# Patient Record
Sex: Female | Born: 2005
Health system: Southern US, Community
[De-identification: ages and names within clinical notes are randomized; demographics above are authoritative.]

## PROBLEM LIST (undated history)

## (undated) ENCOUNTER — Emergency Department: Admission: EM

## (undated) DIAGNOSIS — F909 Attention-deficit hyperactivity disorder, unspecified type: Secondary | ICD-10-CM

## (undated) HISTORY — DX: Attention-deficit hyperactivity disorder, unspecified type: F90.9

---
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
## 2024-01-21 ENCOUNTER — Emergency Department
Admission: EM | Admit: 2024-01-21 | Discharge: 2024-01-21 | Disposition: A | Discharge status: Home / Self Care | Attending: Emergency Medicine | Admitting: Emergency Medicine

## 2024-01-21 ENCOUNTER — Other Ambulatory Visit: Payer: Self-pay

## 2024-01-21 ENCOUNTER — Emergency Department

## 2024-01-21 DIAGNOSIS — R29818 Other symptoms and signs involving the nervous system: Secondary | ICD-10-CM | POA: Diagnosis not present

## 2024-01-21 DIAGNOSIS — G43909 Migraine, unspecified, not intractable, without status migrainosus: Secondary | ICD-10-CM | POA: Diagnosis not present

## 2024-01-21 DIAGNOSIS — G935 Compression of brain: Secondary | ICD-10-CM | POA: Diagnosis not present

## 2024-01-21 DIAGNOSIS — R519 Headache, unspecified: Secondary | ICD-10-CM | POA: Diagnosis not present

## 2024-01-21 LAB — CBC WITH DIFFERENTIAL/PLATELET
Abs Immature Granulocytes: 0.01 10*3/uL (ref 0.00–0.07)
Basophils Absolute: 0.1 10*3/uL (ref 0.0–0.1)
Basophils Relative: 1 %
Eosinophils Absolute: 0.1 10*3/uL (ref 0.0–0.5)
Eosinophils Relative: 1 %
HCT: 41 % (ref 36.0–46.0)
Hemoglobin: 13.8 g/dL (ref 12.0–15.0)
Immature Granulocytes: 0 %
Lymphocytes Relative: 30 %
Lymphs Abs: 2 10*3/uL (ref 0.7–4.0)
MCH: 29.8 pg (ref 26.0–34.0)
MCHC: 33.7 g/dL (ref 30.0–36.0)
MCV: 88.6 fL (ref 80.0–100.0)
Monocytes Absolute: 0.4 10*3/uL (ref 0.1–1.0)
Monocytes Relative: 7 %
Neutro Abs: 4.1 10*3/uL (ref 1.7–7.7)
Neutrophils Relative %: 61 %
Platelets: 231 10*3/uL (ref 150–400)
RBC: 4.63 MIL/uL (ref 3.87–5.11)
RDW: 12.1 % (ref 11.5–15.5)
WBC: 6.6 10*3/uL (ref 4.0–10.5)
nRBC: 0 % (ref 0.0–0.2)

## 2024-01-21 LAB — COMPREHENSIVE METABOLIC PANEL WITH GFR
ALT: 22 U/L (ref 0–44)
AST: 25 U/L (ref 15–41)
Albumin: 4.4 g/dL (ref 3.5–5.0)
Alkaline Phosphatase: 48 U/L (ref 38–126)
Anion gap: 9 (ref 5–15)
BUN: 9 mg/dL (ref 6–20)
CO2: 22 mmol/L (ref 22–32)
Calcium: 9.3 mg/dL (ref 8.9–10.3)
Chloride: 106 mmol/L (ref 98–111)
Creatinine, Ser: 0.66 mg/dL (ref 0.44–1.00)
GFR, Estimated: >60 mL/min (ref >60)
Glucose, Bld: 94 mg/dL (ref 70–99)
Potassium: 4.3 mmol/L (ref 3.5–5.1)
Sodium: 137 mmol/L (ref 135–145)
Total Bilirubin: 0.8 mg/dL (ref 0.0–1.2)
Total Protein: 7.7 g/dL (ref 6.5–8.1)

## 2024-01-21 MED ORDER — GADOBUTROL 1 MMOL/ML IV SOLN
6.0000 mL | Freq: Once | INTRAVENOUS | Status: AC | PRN
  Administered 2024-01-21: 6 mL via INTRAVENOUS

## 2024-01-21 MED ORDER — METOCLOPRAMIDE HCL 5 MG/ML IJ SOLN
10.0000 mg | Freq: Once | INTRAMUSCULAR | Status: AC
  Administered 2024-01-21: 10 mg via INTRAVENOUS
  Filled 2024-01-21: qty 2

## 2024-01-21 MED ORDER — IOHEXOL 350 MG/ML SOLN
75.0000 mL | Freq: Once | INTRAVENOUS | Status: AC | PRN
  Administered 2024-01-21: 75 mL via INTRAVENOUS

## 2024-01-21 MED ORDER — SODIUM CHLORIDE 0.9 % IV BOLUS
1000.0000 mL | Freq: Once | INTRAVENOUS | Status: AC
  Administered 2024-01-21: 1000 mL via INTRAVENOUS

## 2024-01-21 MED ORDER — KETOROLAC TROMETHAMINE 30 MG/ML IJ SOLN
30.0000 mg | Freq: Once | INTRAMUSCULAR | Status: AC
  Administered 2024-01-21: 30 mg via INTRAVENOUS
  Filled 2024-01-21: qty 1

## 2024-01-21 MED ORDER — DIPHENHYDRAMINE HCL 50 MG/ML IJ SOLN
25.0000 mg | Freq: Once | INTRAMUSCULAR | Status: AC
  Administered 2024-01-21: 25 mg via INTRAVENOUS
  Filled 2024-01-21: qty 1

## 2024-01-21 NOTE — Group Note (Deleted)
 Date:  01/21/2024 Time:  9:45 PM  Group Topic/Focus:  Wrap-Up Group:   The focus of this group is to help patients review their daily goal of treatment and discuss progress on daily workbooks.     Participation Level:  {BHH PARTICIPATION EAVWU:98119}  Participation Quality:  {BHH PARTICIPATION QUALITY:22265}  Affect:  {BHH AFFECT:22266}  Cognitive:  {BHH COGNITIVE:22267}  Insight: {BHH Insight2:20797}  Engagement in Group:  {BHH ENGAGEMENT IN JYNWG:95621}  Modes of Intervention:  {BHH MODES OF INTERVENTION:22269}  Additional Comments:  ***  Maglione,Preciosa Bundrick E 01/21/2024, 9:45 PM

## 2024-01-21 NOTE — ED Triage Notes (Addendum)
 Pt c/o migraine since Saturday. Pt has been taking excedrin and motrin  without relief. Pt says she also noticed her L pupil is larger than her R pupil. Both pupils are reactive to light stimulus. Pt denies any vision changes but says it feels drier and has been itchy. No pain to L eye. Pt reports sounds make her HA worse. Denies n/v.

## 2024-01-21 NOTE — ED Notes (Signed)
 Attempted an IV x2 without success. IV team consult made. Pt transported to CT at this time.

## 2024-01-21 NOTE — ED Notes (Signed)
Delay explained to pt.  

## 2024-01-21 NOTE — ED Provider Notes (Signed)
 [facility] Provider Note    Event Date/Time   First MD Initiated Contact with Patient 01/21/24 1415     (approximate)  History   Chief Complaint: Migraine  HPI  Elizabeth Mclaughlin is a 18 y.o. female with a past medical history of ADHD, migraines, presents to the emergency department for headache as well as unequal pupils.  According to the patient over the last 3 days she has been experiencing a migraine headache consistent with her prior migraines, however since Saturday she has also noticed that her left pupil seems slightly more dilated than the right pupil.  Patient denies ever having this previously.  Patient denies any head injuries.  Rates her headache as an 8/10 consistent with prior migraines.  Denies any weakness or numbness of the arm or leg confusion or slurred speech.  Physical Exam   Triage Vital Signs: ED Triage Vitals  Encounter Vitals Group     BP 01/21/24 1227 126/76     Systolic BP Percentile --      Diastolic BP Percentile --      Pulse Rate 01/21/24 1227 70     Resp 01/21/24 1227 16     Temp 01/21/24 1226 98.2 F (36.8 C)     Temp Source 01/21/24 1226 Oral     SpO2 01/21/24 1227 100 %     Weight 01/21/24 1227 140 lb (63.5 kg)     Height 01/21/24 1227 5\' 4"  (1.626 m)     Head Circumference --      Peak Flow --      Pain Score 01/21/24 1227 6     Pain Loc --      Pain Education --      Exclude from Growth Chart --     Most recent vital signs: Vitals:   01/21/24 1226 01/21/24 1227  BP:  126/76  Pulse:  70  Resp:  16  Temp: 98.2 F (36.8 C)   SpO2:  100%    General: Awake, no distress.  CV:  Good peripheral perfusion.  Regular rate and rhythm  Resp:  Normal effort.  Equal breath sounds bilaterally.  Abd:  No distention.   Other:  Patient does have a slightly dilated left pupil compared to the right pupil however both are briskly reactive.   ED Results / Procedures / Treatments   RADIOLOGY  CT  pending   MEDICATIONS ORDERED IN ED: Medications  ketorolac  (TORADOL ) 30 MG/ML injection 30 mg (has no administration in time range)  metoCLOPramide (REGLAN) injection 10 mg (has no administration in time range)  diphenhydrAMINE (BENADRYL) injection 25 mg (has no administration in time range)  sodium chloride  0.9 % bolus 1,000 mL (has no administration in time range)     IMPRESSION / MDM / ASSESSMENT AND PLAN / ED COURSE  I reviewed the triage vital signs and the nursing notes.  Patient's presentation is most consistent with acute presentation with potential threat to life or bodily function.  Patient presents emergency department for 3 days of a headache consistent with past migraines.  Patient also has noticed an irregular pupil on examination patient's left pupil is slightly more dilated than the right pupil both are briskly responsive to light.  Patient states she has had some pain at times just above the left eye, points to her frontal sinus and ethmoid sinus areas.  Denies any congestion or fever.  Reassuring vital signs.  Reassuring physical exam.  I discussed with the patient and  mother pros and cons of CT imaging.  Patient wishes to proceed with a CT scan which I believe is reasonable given no history of irregular pupils in the past.  I believe more likely however patient could be suffering from a complicated migraine.  We will dose Toradol , Reglan, Benadryl, fluids while awaiting CT results.  Patient agreeable to plan.  States she has had a migraine cocktail previously that has worked well for her headaches.  Patient CBC is normal, chemistry is normal.  Lab work is reassuring with a normal CBC and chemistry.  CTA of the head and neck is pending to rule out intracranial abnormality or aneurysm.  Patient care signed out to oncoming provider.  FINAL CLINICAL IMPRESSION(S) / ED DIAGNOSES   Headache Migraine  Note:  This document was prepared using Dragon voice recognition software and  may include unintentional dictation errors.   Ruth Cove, MD 01/23/24 2212

## 2024-01-21 NOTE — ED Notes (Signed)
Pt resting/sleeping comfortably at this time

## 2024-01-21 NOTE — Discharge Instructions (Addendum)
 Please schedule a follow-up appointment with neurosurgery, neurology and ophthalmology to further evaluate this her MRIs are overall reassuring other than the abnormality mentioned below.  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 2. Otherwise normal MRI of the brain and orbits. No other acute intracranial abnormality.

## 2024-01-21 NOTE — Progress Notes (Signed)
 Depo given

## 2024-01-21 NOTE — ED Provider Notes (Signed)
 11:20 PM Assumed care for off going team.   Blood pressure 118/63, pulse 74, temperature 98 F (36.7 C), temperature source Oral, resp. rate 18, height 5\' 4"  (1.626 m), weight 63.5 kg, last menstrual period 01/11/2024, SpO2 100%.  See their HPI for full report but in brief pending CTA  CTA negative  Reevaluated patient continues to have a enlarged left pupil without any other associated symptoms.  Pressures were checked and were normal 13-15.  She is got no proptosis.  Discussed the case with Dr. Doretta Gant from neurology who recommended MRI brain and orbits with and without an MRV  If negative can follow up outpatient.  1. 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  2. Otherwise normal MRI of the brain and orbits. No other acute  intracranial abnormality.   11:21 PM went to go reevaluate patient update on the finding above.  Initially she did have a little bit of pupil discrepancy but after checking the pressure in her eye her pupils actually did appear similar sizes.  According to her this has been happening more the size of the pupils have been changing and that occasionally it is the same size and occasionally it is bigger.  Given this Budd-Chiari malformation with pupil changes I will discuss with neurosurgery to see if this needs any emergent procedure or could be followed up outpatient.  11:26 PM  D/w Henderson Lock but this can be followed up outpatient and may not be causing eye issue.  Reevaluated patient continues to look very well.  She feels comfortable following up outpatient and will discuss with ophthalmology, neurosurgery outpatient and will return to the ER she develops worsening symptoms or any other concerns      Lubertha Rush, MD 01/21/24 2327

## 2024-01-21 NOTE — ED Notes (Signed)
 First nurse note: To ED from [facility] for HA to whole head and L pupil dilated compared to R since 5/17 Hx migraines No numbness or tingling Stopped Imitrex  (unsure date) 100/64, HR 77, 97%, 99.0 oral

## 2024-01-28 DIAGNOSIS — E569 Vitamin deficiency, unspecified: Secondary | ICD-10-CM | POA: Diagnosis not present

## 2024-01-28 DIAGNOSIS — G935 Compression of brain: Secondary | ICD-10-CM | POA: Diagnosis not present

## 2024-01-28 DIAGNOSIS — Z1331 Encounter for screening for depression: Secondary | ICD-10-CM | POA: Diagnosis not present

## 2024-01-28 DIAGNOSIS — F419 Anxiety disorder, unspecified: Secondary | ICD-10-CM | POA: Diagnosis not present

## 2024-01-28 DIAGNOSIS — G43709 Chronic migraine without aura, not intractable, without status migrainosus: Secondary | ICD-10-CM | POA: Diagnosis not present

## 2024-01-28 DIAGNOSIS — F32A Depression, unspecified: Secondary | ICD-10-CM | POA: Diagnosis not present

## 2024-01-30 ENCOUNTER — Ambulatory Visit: Admitting: Physician Assistant

## 2024-02-07 NOTE — Progress Notes (Unsigned)
 Referring Physician:  No referring provider defined for this encounter.  Primary Physician:  Solomon Dupre, DO  History of Present Illness: 02/10/2024 Ms. Elizabeth Mclaughlin is here today with a chief complaint of headaches and dizziness.  Patient has had chronic migraines however has had an increase in her migraines over the past several months.  This was following medication change from Depo-Provera  to birth control patch.  Symptoms include photophobia, phonophobia, nausea, and neck pain.  She has attempted a variety of migraine medications, but is not well-established with a neurologist specializing in headaches.  Most recently she went to the emergency room on 01/21/2024 and a MRI of her head revealed possible Chiari malformation.  Coughing, straining does not make it worse.  At times she will get a shooting pain throughout her body when experiencing migraines.  Her headaches are long-lasting often for the whole day.  The symptoms are causing a significant impact on the patient's life.   Review of Systems:  A 10 point review of systems is negative, except for the pertinent positives and negatives detailed in the HPI.  Past Medical History: Past Medical History:  Diagnosis Date   ADHD (attention deficit hyperactivity disorder)     Past Surgical History: Past Surgical History:  Procedure Laterality Date   KNEE SURGERY Right 08/24/2022   SHOULDER SURGERY Right 03/17/2020   Ball and joint dislocation, with tendon repair    Allergies: Allergies as of 02/10/2024   (No Known Allergies)    Medications: Outpatient Encounter Medications as of 02/10/2024  Medication Sig   FLUoxetine  (PROZAC ) 40 MG capsule Take 1 capsule (40 mg total) by mouth daily.   hydrOXYzine  (VISTARIL ) 25 MG capsule Take 1 capsule (25 mg total) by mouth every 8 (eight) hours as needed.   norelgestromin -ethinyl estradiol  (XULANE) 150-35 MCG/24HR transdermal patch Place 1 patch onto the skin once a week.    rizatriptan (MAXALT) 10 MG tablet Take by mouth.   No facility-administered encounter medications on file as of 02/10/2024.    Social History: Social History   Tobacco Use   Smoking status: Never   Smokeless tobacco: Never  Vaping Use   Vaping status: Never Used  Substance Use Topics   Alcohol use: No   Drug use: No    Family Medical History: Family History  Problem Relation Age of Onset   Hypertension Mother    Hypertension Father    COPD Maternal Grandfather    Cancer Paternal Grandmother        Cervical Cancer   Thyroid  disease Paternal Grandmother     Physical Examination: @VITALWITHPAIN @  General: Patient is well developed, well nourished, calm, collected, and in no apparent distress. Attention to examination is appropriate.  Psychiatric: Patient is non-anxious.  Head:  Pupils equal, round, and reactive to light.  ENT:  Oral mucosa appears well hydrated.  Neck:   Supple.  Full range of motion.  Respiratory: Patient is breathing without any difficulty.  Extremities: No edema.  Vascular: Palpable dorsal pedal pulses.  Skin:   On exposed skin, there are no abnormal skin lesions.  NEUROLOGICAL:     Awake, alert, oriented to person, place, and time.  Speech is clear and fluent. Fund of knowledge is appropriate.   Cranial Nerves: Pupils equal round and reactive to light.  Facial tone is symmetric.  Facial sensation is symmetric.  No nystagmus.  EOMI.      Baseline right pupil> left 1 mm    Gait is normal.   No  difficulty with tandem gait.   No evidence of dysmetria noted.  Medical Decision Making  Imaging: EXAM: MRI HEAD AND ORBITS WITHOUT AND WITH CONTRAST   TECHNIQUE: Multiplanar, multiecho pulse sequences of the brain and surrounding structures were obtained without and with intravenous contrast. Multiplanar, multiecho pulse sequences of the orbits and surrounding structures were obtained including fat saturation techniques, before and  after intravenous contrast administration.   CONTRAST:  6mL GADAVIST  GADOBUTROL  1 MMOL/ML IV SOLN   COMPARISON:  CT from earlier the same day.   FINDINGS: MRI HEAD FINDINGS   Brain: Cerebral volume within normal limits. No focal parenchymal signal abnormality. No evidence for acute or subacute infarct. No areas of chronic cortical infarction or other insult. No acute or chronic intracranial blood products.   No mass lesion, midline shift or mass effect no hydrocephalus or extra-axial fluid collection. Pituitary gland and suprasellar region within normal limits. No abnormal enhancement.   Vascular: Major intracranial vascular flow voids are maintained.   Skull and upper cervical spine: Cerebellar tonsils are low lying extending up to 9 mm below the foramen magnum, consistent with Chiari 1 malformation. Visualized upper cervical spinal cord within normal limits. Bone marrow signal intensity normal. No scalp soft tissue abnormality.   Other: No mastoid effusion.   MRI ORBITS FINDINGS   Orbits: Globes are symmetric in size with normal appearance and morphology. Optic nerves symmetric and within normal limits. No abnormal enhancement to suggest acute optic neuritis. Extra-ocular muscles symmetric and normal. Lacrimal glands normal. Intraconal and extraconal fat well-maintained. No abnormality about the orbital apices or cavernous sinus. Optic chiasm within normal limits.   Visualized sinuses: Clear.   Soft tissues: Unremarkable.   IMPRESSION: 1. 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 2. Otherwise normal MRI of the brain and orbits. No other acute intracranial abnormality.    Dr. Felipe Horton and I I have personally reviewed the images and believe that there is a discrepancy in the measurement.  Cerebellar tonsils extending approximately 4 mm below the foreman magnum.  Assessment and Plan: Ms. Elizabeth Mclaughlin is a pleasant 18 y.o.  female is here today with a chief complaint of headaches and dizziness.  Patient has had chronic migraines however has had an increase in her migraines over the past several months.  This was following medication change from Depo-Provera  to birth control patch.  Symptoms include photophobia, phonophobia, nausea, and neck pain.  She has attempted a variety of migraine medications, but is not well-established with a neurologist specializing in headaches.  Most recently she went to the emergency room on 01/21/2024 and a MRI of her head revealed possible Chiari malformation.  Coughing, straining does not make it worse.  At times she will get a shooting pain throughout her body when experiencing migraines.  Her headaches are long-lasting often for the whole day.  Detailed examination as above.  MRI interpretation reviewed by Dr. Felipe Horton and myself with a discrepancy in the extension of the cerebellar tonsils based off measuring site.  Likely close to 4-5 mm.  Pleasure to see patient in clinic today.  Discussed with patient and family at length the MRI read.  We do not feel as though this is related to her migraines as her headaches are different in nature and not characterized by short intense headaches with straining.  There is also very little tonsillar extension into the foreman magnum.  The increase in headaches was also related to medication change.  Advised patient to continue follow-up with ophthalmology and neurology for her headaches and eyesight changes.  No neurosurgical intervention will be needed at this time.  All questions were answered and concerns addressed.    Thank you for involving me in the care of this patient.   I spent a total of 60 minutes in both face-to-face and non-face-to-face activities for this visit on the date of this encounter.   Ludwig Safer, PA-C Dept. of Neurosurgery

## 2024-02-10 ENCOUNTER — Ambulatory Visit: Admitting: Physician Assistant

## 2024-02-10 ENCOUNTER — Encounter: Payer: Self-pay | Admitting: Physician Assistant

## 2024-02-10 VITALS — BP 114/76 | Ht 64.0 in | Wt 140.0 lb

## 2024-02-10 DIAGNOSIS — G935 Compression of brain: Secondary | ICD-10-CM | POA: Diagnosis not present

## 2024-02-10 DIAGNOSIS — R930 Abnormal findings on diagnostic imaging of skull and head, not elsewhere classified: Secondary | ICD-10-CM

## 2024-02-28 ENCOUNTER — Other Ambulatory Visit: Payer: Self-pay

## 2024-02-28 ENCOUNTER — Ambulatory Visit

## 2024-03-02 ENCOUNTER — Ambulatory Visit: Admitting: Family Medicine

## 2024-03-19 ENCOUNTER — Telehealth: Payer: Self-pay | Admitting: Physician Assistant

## 2024-03-19 NOTE — Telephone Encounter (Signed)
 Patient is calling to give an update on the symptoms she has been experiencing. She states that whenever she is active she starts shaking, feels like her head is bobbing, feels tired, dizzy, and has body aches. She states that she has seen Dr. Maree and he told her to stay active but is unsure why being active makes her feel so bad. She would like to know what she needs to do. Please advise.

## 2024-03-19 NOTE — Telephone Encounter (Signed)
 I spoke to patient and informed her of the response from [facility]. Patient states she will reach out to Dr. Maree, she does not want a second opinion at this time.    These symptoms likely wouldn't be related to a chiari-Dr. Claudene agrees. If she wants a second opinion she can go to [facility] but otherwise should continue seeing Dr. Maree. This is non operative.

## 2024-03-28 ENCOUNTER — Other Ambulatory Visit: Payer: Self-pay | Admitting: Family Medicine

## 2024-03-30 NOTE — Telephone Encounter (Signed)
 Requested Prescriptions  Pending Prescriptions Disp Refills   FLUoxetine  (PROZAC ) 40 MG capsule [Pharmacy Med Name: FLUOXETINE  HCL 40 MG CAPSULE] 90 capsule 0    Sig: TAKE 1 CAPSULE (40 MG TOTAL) BY MOUTH DAILY.     Psychiatry:  Antidepressants - SSRI Passed - 03/30/2024  2:29 PM      Passed - Valid encounter within last 6 months    Recent Outpatient Visits           3 months ago Panic attacks   [place] [facility] [place], Megan P, DO   4 months ago Panic attacks   [place] [facility] [place], [place], DO   4 months ago Panic attacks   [place] [facility] [place], [place], DO   5 months ago Panic attacks    [facility] [place], Duwaine SQUIBB, DO       Future Appointments             In 5 months Vicci, Duwaine SQUIBB, DO  [facility]

## 2024-04-09 ENCOUNTER — Emergency Department
Admission: EM | Admit: 2024-04-09 | Discharge: 2024-04-09 | Discharge status: Against Medical Advice | Attending: Emergency Medicine | Admitting: Emergency Medicine

## 2024-04-09 ENCOUNTER — Other Ambulatory Visit: Payer: Self-pay

## 2024-04-09 DIAGNOSIS — G43909 Migraine, unspecified, not intractable, without status migrainosus: Secondary | ICD-10-CM | POA: Insufficient documentation

## 2024-04-09 DIAGNOSIS — Z5321 Procedure and treatment not carried out due to patient leaving prior to being seen by health care provider: Secondary | ICD-10-CM | POA: Insufficient documentation

## 2024-04-09 NOTE — ED Triage Notes (Signed)
 Pt to ED via POV c/o migraine x4 days. Has hx of migraines. Has taken OTC meds without relief.

## 2024-04-29 DIAGNOSIS — G935 Compression of brain: Secondary | ICD-10-CM | POA: Diagnosis not present

## 2024-04-29 DIAGNOSIS — M5481 Occipital neuralgia: Secondary | ICD-10-CM | POA: Diagnosis not present

## 2024-04-29 DIAGNOSIS — M7918 Myalgia, other site: Secondary | ICD-10-CM | POA: Diagnosis not present

## 2024-04-29 DIAGNOSIS — G43E09 Chronic migraine with aura, not intractable, without status migrainosus: Secondary | ICD-10-CM | POA: Diagnosis not present

## 2024-07-01 ENCOUNTER — Other Ambulatory Visit: Payer: Self-pay | Admitting: Family Medicine

## 2024-07-02 NOTE — Telephone Encounter (Signed)
 Requested Prescriptions  Pending Prescriptions Disp Refills   FLUoxetine  (PROZAC ) 40 MG capsule [Pharmacy Med Name: FLUOXETINE  HCL 40 MG CAPSULE] 90 capsule 0    Sig: TAKE 1 CAPSULE (40 MG TOTAL) BY MOUTH DAILY.     Psychiatry:  Antidepressants - SSRI Failed - 07/02/2024  2:11 PM      Failed - Valid encounter within last 6 months    Recent Outpatient Visits           6 months ago Panic attacks   [place] [facility] [place], Megan P, DO   7 months ago Panic attacks   [place] [facility] [place], [place], DO   7 months ago Panic attacks   [place] [facility] [place], [place], DO   8 months ago Panic attacks   [place] [facility] [place], Duwaine SQUIBB, DO       Future Appointments             In 2 months Vicci, Duwaine SQUIBB, DO [place] [facility] [place], [address]

## 2024-07-07 DIAGNOSIS — J069 Acute upper respiratory infection, unspecified: Secondary | ICD-10-CM | POA: Diagnosis not present

## 2024-07-09 DIAGNOSIS — G935 Compression of brain: Secondary | ICD-10-CM | POA: Diagnosis not present

## 2024-07-09 DIAGNOSIS — M5481 Occipital neuralgia: Secondary | ICD-10-CM | POA: Diagnosis not present

## 2024-07-09 DIAGNOSIS — G43E09 Chronic migraine with aura, not intractable, without status migrainosus: Secondary | ICD-10-CM | POA: Diagnosis not present

## 2024-07-09 DIAGNOSIS — G4440 Drug-induced headache, not elsewhere classified, not intractable: Secondary | ICD-10-CM | POA: Diagnosis not present

## 2024-07-09 DIAGNOSIS — M7918 Myalgia, other site: Secondary | ICD-10-CM | POA: Diagnosis not present

## 2024-07-09 DIAGNOSIS — E538 Deficiency of other specified B group vitamins: Secondary | ICD-10-CM | POA: Diagnosis not present

## 2024-07-14 DIAGNOSIS — M7918 Myalgia, other site: Secondary | ICD-10-CM | POA: Diagnosis not present

## 2024-07-14 DIAGNOSIS — G43E09 Chronic migraine with aura, not intractable, without status migrainosus: Secondary | ICD-10-CM | POA: Diagnosis not present

## 2024-07-14 DIAGNOSIS — M5481 Occipital neuralgia: Secondary | ICD-10-CM | POA: Diagnosis not present

## 2024-07-14 DIAGNOSIS — G935 Compression of brain: Secondary | ICD-10-CM | POA: Diagnosis not present

## 2024-07-14 DIAGNOSIS — G4440 Drug-induced headache, not elsewhere classified, not intractable: Secondary | ICD-10-CM | POA: Diagnosis not present

## 2024-07-14 DIAGNOSIS — E538 Deficiency of other specified B group vitamins: Secondary | ICD-10-CM | POA: Diagnosis not present

## 2024-09-08 ENCOUNTER — Encounter: Payer: Self-pay | Admitting: Family Medicine

## 2024-09-08 ENCOUNTER — Ambulatory Visit (INDEPENDENT_AMBULATORY_CARE_PROVIDER_SITE_OTHER): Payer: Self-pay | Admitting: Family Medicine

## 2024-09-08 VITALS — BP 121/76 | HR 67 | Temp 98.0°F | Ht 64.0 in | Wt 167.0 lb

## 2024-09-08 DIAGNOSIS — Z0000 Encounter for general adult medical examination without abnormal findings: Secondary | ICD-10-CM

## 2024-09-08 DIAGNOSIS — Z23 Encounter for immunization: Secondary | ICD-10-CM | POA: Diagnosis not present

## 2024-09-08 NOTE — Progress Notes (Signed)
 "  BP 121/76   Pulse 67   Temp 98 F (36.7 C) (Oral)   Ht 5' 4 (1.626 m)   Wt 167 lb (75.8 kg)   SpO2 98%   BMI 28.67 kg/m    Subjective:    Patient ID: Elizabeth Mclaughlin, female    DOB: Mar 23, 2006, 19 y.o.   MRN: [id number]  HPI: Elizabeth Mclaughlin is a 19 y.o. female presenting on 09/08/2024 for comprehensive medical examination. Current medical complaints include:  Mood is not doing well. Will come back for mood appointment in 2 days  Menopausal Symptoms: no  Depression Screen done today and results listed below:     09/08/2024    3:51 PM 12/31/2023    2:10 PM 11/11/2023    4:05 PM 10/30/2023    8:27 AM 09/24/2023    2:20 PM  Depression screen PHQ 2/9  Decreased Interest 2 0 1 2 1   Down, Depressed, Hopeless 3 0 1 1 1   PHQ - 2 Score 5 0 2 3 2   Altered sleeping 2 1 2 2 2   Tired, decreased energy 2 1 2 2 1   Change in appetite 2 0 2 3 2   Feeling bad or failure about yourself  3 0 1 1 1   Trouble concentrating 1 1 1 2 1   Moving slowly or fidgety/restless 2 0 2 1 2   Suicidal thoughts 1  0 0 0  PHQ-9 Score 18 3  12  14  11    Difficult doing work/chores Very difficult Not difficult at all Somewhat difficult Somewhat difficult Somewhat difficult     Data saved with a previous flowsheet row definition    Past Medical History:  Past Medical History:  Diagnosis Date   ADHD (attention deficit hyperactivity disorder)     Surgical History:  Past Surgical History:  Procedure Laterality Date   KNEE SURGERY Right 08/24/2022   SHOULDER SURGERY Right 03/17/2020   Mercer and joint dislocation, with tendon repair    Medications:  Current Outpatient Medications on File Prior to Visit  Medication Sig   cyanocobalamin  (VITAMIN B12) 1000 MCG tablet Take 1,000 mcg by mouth.   EMGALITY 120 MG/ML SOAJ INJECT 240 MG(TWO PENS) SUBCUTANEOUSLY ONCE FOR 1 DOSE   FLUoxetine  (PROZAC ) 40 MG capsule TAKE 1 CAPSULE (40 MG TOTAL) BY MOUTH DAILY.   hydrOXYzine  (VISTARIL ) 25 MG capsule Take 1 capsule (25  mg total) by mouth every 8 (eight) hours as needed.   magnesium oxide (MAG-OX) 400 MG tablet Take 400 mg by mouth.   rizatriptan (MAXALT) 10 MG tablet Take by mouth.   No current facility-administered medications on file prior to visit.    Allergies:  Allergies[1]  Social History:  Social History   Socioeconomic History   Marital status: Single    Spouse name: Not on file   Number of children: Not on file   Years of education: Not on file   Highest education level: Not on file  Occupational History   Not on file  Tobacco Use   Smoking status: Never   Smokeless tobacco: Never  Vaping Use   Vaping status: Never Used  Substance and Sexual Activity   Alcohol use: No   Drug use: No   Sexual activity: Yes    Birth control/protection: Injection  Other Topics Concern   Not on file  Social History Narrative   She lives with both parents.   She has two brothers.   Social Drivers of Health   Tobacco Use: Low  Risk (09/08/2024)   Patient History    Smoking Tobacco Use: Never    Smokeless Tobacco Use: Never    Passive Exposure: Not on file  Financial Resource Strain: Low Risk  (01/28/2024)   Received from [facility]   Overall Financial Resource Strain (CARDIA)    Difficulty of Paying Living Expenses: Not hard at all  Food Insecurity: No Food Insecurity (01/28/2024)   Received from [facility]   Epic    Within the past 12 months, you worried that your food would run out before you got the money to buy more.: Never true    Within the past 12 months, the food you bought just didn't last and you didn't have money to get more.: Never true  Transportation Needs: No Transportation Needs (01/28/2024)   Received from [facility] - Transportation    In the past 12 months, has lack of transportation kept you from medical appointments or from getting medications?: No    Lack of Transportation (Non-Medical): No  Physical  Activity: Inactive (09/08/2024)   Exercise Vital Sign    Days of Exercise per Week: 0 days    Minutes of Exercise per Session: 0 min  Stress: Stress Concern Present (09/08/2024)   [employer] - Occupational Stress Questionnaire    Feeling of Stress: Very much  Social Connections: Socially Isolated (09/08/2024)   Social Connection and Isolation Panel    Frequency of Communication with Friends and Family: Never    Frequency of Social Gatherings with Friends and Family: Once a week    Attends Religious Services: Never    Database Administrator or Organizations: No    Attends Banker Meetings: Never    Marital Status: Living with partner  Intimate Partner Violence: Not on file  Depression (PHQ2-9): High Risk (09/08/2024)   Depression (PHQ2-9)    PHQ-2 Score: 18  Alcohol Screen: Low Risk (09/18/2023)   Alcohol Screen    Last Alcohol Screening Score (AUDIT): 0  Housing: Low Risk  (01/28/2024)   Received from [facility]   Epic    In the last 12 months, was there a time when you were not able to pay the mortgage or rent on time?: No    In the past 12 months, how many times have you moved where you were living?: 0    At any time in the past 12 months, were you homeless or living in a shelter (including now)?: No  Utilities: Not At Risk (01/28/2024)   Received from [facility] Utilities    Threatened with loss of utilities: No  Health Literacy: Adequate Health Literacy (09/18/2023)   B1300 Health Literacy    Frequency of need for help with medical instructions: Never   Tobacco Use History[2] Social History   Substance and Sexual Activity  Alcohol Use No    Family History:  Family History  Problem Relation Age of Onset   Hypertension Mother    Hypertension Father    COPD Maternal Grandfather    Cancer Paternal Grandmother        Cervical Cancer   Thyroid  disease Paternal Grandmother     Past medical  history, surgical history, medications, allergies, family history and social history reviewed with patient today and changes made to appropriate areas of the chart.   Review of Systems  Constitutional: Negative.   HENT: Negative.    Respiratory:  Positive for shortness of breath (with anxiety). Negative for cough, hemoptysis, sputum production and wheezing.   Cardiovascular:  Positive for chest pain (with anxiety) and palpitations (with anxiety). Negative for orthopnea, claudication, leg swelling and PND.  Gastrointestinal:  Positive for nausea (with anxiety). Negative for abdominal pain, blood in stool, constipation, diarrhea, heartburn, melena and vomiting.  Genitourinary: Negative.   Musculoskeletal: Negative.   Skin: Negative.   Neurological:  Positive for headaches. Negative for dizziness, tingling, tremors, sensory change, speech change, focal weakness, seizures, loss of consciousness and weakness.  Endo/Heme/Allergies:  Negative for environmental allergies and polydipsia. Bruises/bleeds easily.  Psychiatric/Behavioral:  Positive for depression. Negative for hallucinations, memory loss, substance abuse and suicidal ideas. The patient is nervous/anxious. The patient does not have insomnia.    All other ROS negative except what is listed above and in the HPI.      Objective:    BP 121/76   Pulse 67   Temp 98 F (36.7 C) (Oral)   Ht 5' 4 (1.626 m)   Wt 167 lb (75.8 kg)   SpO2 98%   BMI 28.67 kg/m   Wt Readings from Last 3 Encounters:  09/08/24 167 lb (75.8 kg) (90%, Z= 1.31)*  04/09/24 145 lb (65.8 kg) (76%, Z= 0.70)*  02/10/24 140 lb (63.5 kg) (71%, Z= 0.54)*   * Growth percentiles are based on CDC (Girls, 2-20 Years) data.    Physical Exam Vitals and nursing note reviewed.  Constitutional:      General: She is not in acute distress.    Appearance: Normal appearance. She is not ill-appearing, toxic-appearing or diaphoretic.  HENT:     Head: Normocephalic and atraumatic.      Right Ear: Tympanic membrane, ear canal and external ear normal. There is no impacted cerumen.     Left Ear: Tympanic membrane, ear canal and external ear normal. There is no impacted cerumen.     Nose: Nose normal. No congestion or rhinorrhea.     Mouth/Throat:     Mouth: Mucous membranes are moist.     Pharynx: Oropharynx is clear. No oropharyngeal exudate or posterior oropharyngeal erythema.  Eyes:     General: No scleral icterus.       Right eye: No discharge.        Left eye: No discharge.     Extraocular Movements: Extraocular movements intact.     Conjunctiva/sclera: Conjunctivae normal.     Pupils: Pupils are equal, round, and reactive to light.  Neck:     Vascular: No carotid bruit.  Cardiovascular:     Rate and Rhythm: Normal rate and regular rhythm.     Pulses: Normal pulses.     Heart sounds: No murmur heard.    No friction rub. No gallop.  Pulmonary:     Effort: Pulmonary effort is normal. No respiratory distress.     Breath sounds: Normal breath sounds. No stridor. No wheezing, rhonchi or rales.  Chest:     Chest wall: No tenderness.  Abdominal:     General: Abdomen is flat. Bowel sounds are normal. There is no distension.     Palpations: Abdomen is soft. There is no mass.     Tenderness: There is no abdominal tenderness. There is no right CVA tenderness, left CVA tenderness, guarding or rebound.     Hernia: No hernia is present.  Genitourinary:    Comments: Breast and pelvic exams deferred with shared decision making Musculoskeletal:        General: No  swelling, tenderness, deformity or signs of injury.     Cervical back: Normal range of motion and neck supple. No rigidity. No muscular tenderness.     Right lower leg: No edema.     Left lower leg: No edema.  Lymphadenopathy:     Cervical: No cervical adenopathy.  Skin:    General: Skin is warm and dry.     Capillary Refill: Capillary refill takes less than 2 seconds.     Coloration: Skin is not jaundiced  or pale.     Findings: No bruising, erythema, lesion or rash.  Neurological:     General: No focal deficit present.     Mental Status: She is alert and oriented to person, place, and time. Mental status is at baseline.     Cranial Nerves: No cranial nerve deficit.     Sensory: No sensory deficit.     Motor: No weakness.     Coordination: Coordination normal.     Gait: Gait normal.     Deep Tendon Reflexes: Reflexes normal.  Psychiatric:        Mood and Affect: Mood normal.        Behavior: Behavior normal.        Thought Content: Thought content normal.        Judgment: Judgment normal.     Results for orders placed or performed during the hospital encounter of 01/21/24  CBC with Differential   Collection Time: 01/21/24 12:31 PM  Result Value Ref Range   WBC 6.6 4.0 - 10.5 K/uL   RBC 4.63 3.87 - 5.11 MIL/uL   Hemoglobin 13.8 12.0 - 15.0 g/dL   HCT 58.9 63.9 - 53.9 %   MCV 88.6 80.0 - 100.0 fL   MCH 29.8 26.0 - 34.0 pg   MCHC 33.7 30.0 - 36.0 g/dL   RDW 87.8 88.4 - 84.4 %   Platelets 231 150 - 400 K/uL   nRBC 0.0 0.0 - 0.2 %   Neutrophils Relative % 61 %   Neutro Abs 4.1 1.7 - 7.7 K/uL   Lymphocytes Relative 30 %   Lymphs Abs 2.0 0.7 - 4.0 K/uL   Monocytes Relative 7 %   Monocytes Absolute 0.4 0.1 - 1.0 K/uL   Eosinophils Relative 1 %   Eosinophils Absolute 0.1 0.0 - 0.5 K/uL   Basophils Relative 1 %   Basophils Absolute 0.1 0.0 - 0.1 K/uL   Immature Granulocytes 0 %   Abs Immature Granulocytes 0.01 0.00 - 0.07 K/uL  Comprehensive metabolic panel   Collection Time: 01/21/24 12:31 PM  Result Value Ref Range   Sodium 137 135 - 145 mmol/L   Potassium 4.3 3.5 - 5.1 mmol/L   Chloride 106 98 - 111 mmol/L   CO2 22 22 - 32 mmol/L   Glucose, Bld 94 70 - 99 mg/dL   BUN 9 6 - 20 mg/dL   Creatinine, Ser 9.33 0.44 - 1.00 mg/dL   Calcium 9.3 8.9 - 89.6 mg/dL   Total Protein 7.7 6.5 - 8.1 g/dL   Albumin 4.4 3.5 - 5.0 g/dL   AST 25 15 - 41 U/L   ALT 22 0 - 44 U/L   Alkaline  Phosphatase 48 38 - 126 U/L   Total Bilirubin 0.8 0.0 - 1.2 mg/dL   GFR, Estimated >39 >39 mL/min   Anion gap 9 5 - 15      Assessment & Plan:   Problem List Items Addressed This Visit   None Visit Diagnoses  Routine general medical examination at a health care facility    -  Primary   Vaccines updated. Screening labs checked today. Continue diet and exercise. Call with any concerns.   Relevant Orders   CBC with Differential/Platelet   Comprehensive metabolic panel with GFR   Lipid Panel w/o Chol/HDL Ratio   TSH   GC/Chlamydia Probe Amp     Needs flu shot       Flu shot given today.   Relevant Orders   Flu vaccine trivalent PF, 6mos and older(Flulaval,Afluria,Fluarix,Fluzone)        Follow up plan: Return Thursday at 11:20AM.   LABORATORY TESTING:  - Pap smear: not applicable  IMMUNIZATIONS:   - Tdap: Tetanus vaccination status reviewed: last tetanus booster within 10 years. - Influenza: Administered today - Pneumovax: Not applicable - Prevnar: Up to date - COVID: Refused - HPV: Administered today   PATIENT COUNSELING:   Advised to take 1 mg of folate supplement per day if capable of pregnancy.   Sexuality: Discussed sexually transmitted diseases, partner selection, use of condoms, avoidance of unintended pregnancy  and contraceptive alternatives.   Advised to avoid cigarette smoking.  I discussed with the patient that most people either abstain from alcohol or drink within safe limits (<=14/week and <=4 drinks/occasion for males, <=7/weeks and <= 3 drinks/occasion for females) and that the risk for alcohol disorders and other health effects rises proportionally with the number of drinks per week and how often a drinker exceeds daily limits.  Discussed cessation/primary prevention of drug use and availability of treatment for abuse.   Diet: Encouraged to adjust caloric intake to maintain  or achieve ideal body weight, to reduce intake of dietary saturated  fat and total fat, to limit sodium intake by avoiding high sodium foods and not adding table salt, and to maintain adequate dietary potassium and calcium preferably from fresh fruits, vegetables, and low-fat dairy products.    stressed the importance of regular exercise  Injury prevention: Discussed safety belts, safety helmets, smoke detector, smoking near bedding or upholstery.   Dental health: Discussed importance of regular tooth brushing, flossing, and dental visits.    NEXT PREVENTATIVE PHYSICAL DUE IN 1 YEAR. Return Thursday at 11:20AM.              [1] No Known Allergies [2]  Social History Tobacco Use  Smoking Status Never  Smokeless Tobacco Never   "

## 2024-09-09 ENCOUNTER — Encounter: Payer: Self-pay | Admitting: Family Medicine

## 2024-09-09 LAB — CBC WITH DIFFERENTIAL/PLATELET
Basophils Absolute: 0 x10E3/uL (ref 0.0–0.2)
Basos: 1 %
EOS (ABSOLUTE): 0.1 x10E3/uL (ref 0.0–0.4)
Eos: 1 %
Hematocrit: 42.3 % (ref 34.0–46.6)
Hemoglobin: 14.4 g/dL (ref 11.1–15.9)
Immature Grans (Abs): 0 x10E3/uL (ref 0.0–0.1)
Immature Granulocytes: 0 %
Lymphocytes Absolute: 2.1 x10E3/uL (ref 0.7–3.1)
Lymphs: 27 %
MCH: 30.8 pg (ref 26.6–33.0)
MCHC: 34 g/dL (ref 31.5–35.7)
MCV: 91 fL (ref 79–97)
Monocytes Absolute: 0.6 x10E3/uL (ref 0.1–0.9)
Monocytes: 8 %
Neutrophils Absolute: 4.9 x10E3/uL (ref 1.4–7.0)
Neutrophils: 63 %
Platelets: 276 x10E3/uL (ref 150–450)
RBC: 4.67 x10E6/uL (ref 3.77–5.28)
RDW: 12.2 % (ref 11.7–15.4)
WBC: 7.7 x10E3/uL (ref 3.4–10.8)

## 2024-09-09 LAB — COMPREHENSIVE METABOLIC PANEL WITH GFR
ALT: 14 IU/L (ref 0–32)
AST: 17 IU/L (ref 0–40)
Albumin: 4.9 g/dL (ref 4.0–5.0)
Alkaline Phosphatase: 80 IU/L (ref 42–106)
BUN/Creatinine Ratio: 19 (ref 9–23)
BUN: 13 mg/dL (ref 6–20)
Bilirubin Total: 0.5 mg/dL (ref 0.0–1.2)
CO2: 22 mmol/L (ref 20–29)
Calcium: 9.6 mg/dL (ref 8.7–10.2)
Chloride: 101 mmol/L (ref 96–106)
Creatinine, Ser: 0.69 mg/dL (ref 0.57–1.00)
Globulin, Total: 2.5 g/dL (ref 1.5–4.5)
Glucose: 83 mg/dL (ref 70–99)
Potassium: 3.9 mmol/L (ref 3.5–5.2)
Sodium: 137 mmol/L (ref 134–144)
Total Protein: 7.4 g/dL (ref 6.0–8.5)
eGFR: 128 mL/min/1.73

## 2024-09-09 LAB — LIPID PANEL W/O CHOL/HDL RATIO
Cholesterol, Total: 166 mg/dL (ref 100–169)
HDL: 51 mg/dL
LDL Chol Calc (NIH): 102 mg/dL (ref 0–109)
Triglycerides: 68 mg/dL (ref 0–89)
VLDL Cholesterol Cal: 13 mg/dL (ref 5–40)

## 2024-09-09 LAB — TSH: TSH: 0.619 u[IU]/mL (ref 0.450–4.500)

## 2024-09-09 NOTE — Telephone Encounter (Signed)
 We can absolutely have this me virtual

## 2024-09-10 ENCOUNTER — Ambulatory Visit: Payer: Self-pay | Admitting: Family Medicine

## 2024-09-10 ENCOUNTER — Ambulatory Visit: Admitting: Family Medicine

## 2024-09-10 ENCOUNTER — Encounter: Payer: Self-pay | Admitting: Family Medicine

## 2024-09-10 VITALS — BP 135/80 | HR 70 | Temp 98.0°F | Ht 64.0 in | Wt 167.0 lb

## 2024-09-10 DIAGNOSIS — F422 Mixed obsessional thoughts and acts: Secondary | ICD-10-CM | POA: Insufficient documentation

## 2024-09-10 DIAGNOSIS — F410 Panic disorder [episodic paroxysmal anxiety] without agoraphobia: Secondary | ICD-10-CM | POA: Diagnosis not present

## 2024-09-10 LAB — GC/CHLAMYDIA PROBE AMP
Chlamydia trachomatis, NAA: NEGATIVE
Neisseria Gonorrhoeae by PCR: NEGATIVE

## 2024-09-10 MED ORDER — HYDROXYZINE PAMOATE 25 MG PO CAPS: 25.0000 mg | ORAL_CAPSULE | Freq: Three times a day (TID) | ORAL | 3 refills | Status: AC | PRN

## 2024-09-10 MED ORDER — SERTRALINE HCL 50 MG PO TABS: 50.0000 mg | ORAL_TABLET | Freq: Every day | ORAL | 3 refills | Status: AC

## 2024-09-10 NOTE — Assessment & Plan Note (Signed)
 Will change from prozac  to sertraline  and recheck in about 6 weeks. Call with any concerns

## 2024-09-10 NOTE — Progress Notes (Signed)
 "  BP 135/80   Pulse 70   Temp 98 F (36.7 C) (Oral)   Ht 5' 4 (1.626 m)   Wt 167 lb (75.8 kg)   SpO2 98%   BMI 28.67 kg/m    Subjective:    Patient ID: Elizabeth Mclaughlin, female    DOB: 04-07-2006, 19 y.o.   MRN: [id number]  HPI: Elizabeth Mclaughlin is a 19 y.o. female  Chief Complaint  Patient presents with   Anxiety   Depression   Stress   ANXIETY/DEPRESSION- engaged, about to move in with her fiance. She notes that he has noticed that it's effecting her relationship with her. Moving away from family has been hard. She has been noticing it more. Has missed her prozac  for a while, but restarted it and has been taking it regularly for about the past 1.5 months Duration: chronic Status:uncontrolled Anxious mood: yes  Excessive worrying: yes Irritability: yes  Sweating: no Nausea: no Palpitations:yes Hyperventilation: no Panic attacks: yes Agoraphobia: no  Obscessions/compulsions: yes Depressed mood: yes    09/08/2024    3:51 PM 12/31/2023    2:10 PM 11/11/2023    4:05 PM 10/30/2023    8:27 AM 09/24/2023    2:20 PM  Depression screen PHQ 2/9  Decreased Interest 2 0 1 2 1   Down, Depressed, Hopeless 3 0 1 1 1   PHQ - 2 Score 5 0 2 3 2   Altered sleeping 2 1 2 2 2   Tired, decreased energy 2 1 2 2 1   Change in appetite 2 0 2 3 2   Feeling bad or failure about yourself  3 0 1 1 1   Trouble concentrating 1 1 1 2 1   Moving slowly or fidgety/restless 2 0 2 1 2   Suicidal thoughts 1  0 0 0  PHQ-9 Score 18 3  12  14  11    Difficult doing work/chores Very difficult Not difficult at all Somewhat difficult Somewhat difficult Somewhat difficult     Data saved with a previous flowsheet row definition      09/08/2024    3:52 PM 12/31/2023    2:11 PM 11/11/2023    4:06 PM 10/30/2023    8:27 AM  GAD 7 : Generalized Anxiety Score  Nervous, Anxious, on Edge 2 1 2 2   Control/stop worrying 3 0 2 2  Worry too much - different things 3 0 2 2  Trouble relaxing 1 1 2 1   Restless 1 0 1 1  Easily  annoyed or irritable 3 1 2 1   Afraid - awful might happen 2 0 1 2  Total GAD 7 Score 15 3 12 11   Anxiety Difficulty Very difficult Somewhat difficult Somewhat difficult Somewhat difficult   Anhedonia: no Weight changes: no Insomnia: no   Hypersomnia: no Fatigue/loss of energy: yes Feelings of worthlessness: no Feelings of guilt: yes Impaired concentration/indecisiveness: no Suicidal ideations: no  Crying spells: no Recent Stressors/Life Changes: no   Relationship problems: no   Family stress: yes     Financial stress: no    Job stress: no    Recent death/loss: no  Relevant past medical, surgical, family and social history reviewed and updated as indicated. Interim medical history since our last visit reviewed. Allergies and medications reviewed and updated.  Review of Systems  Constitutional: Negative.   Respiratory: Negative.    Cardiovascular: Negative.   Musculoskeletal: Negative.   Psychiatric/Behavioral:  Positive for dysphoric mood. Negative for agitation, behavioral problems, confusion, decreased concentration, hallucinations, self-injury, sleep  disturbance and suicidal ideas. The patient is nervous/anxious. The patient is not hyperactive.     Per HPI unless specifically indicated above     Objective:    BP 135/80   Pulse 70   Temp 98 F (36.7 C) (Oral)   Ht 5' 4 (1.626 m)   Wt 167 lb (75.8 kg)   SpO2 98%   BMI 28.67 kg/m   Wt Readings from Last 3 Encounters:  09/10/24 167 lb (75.8 kg) (90%, Z= 1.31)*  09/08/24 167 lb (75.8 kg) (90%, Z= 1.31)*  04/09/24 145 lb (65.8 kg) (76%, Z= 0.70)*   * Growth percentiles are based on CDC (Girls, 2-20 Years) data.    Physical Exam Vitals and nursing note reviewed.  Constitutional:      General: She is not in acute distress.    Appearance: Normal appearance. She is not ill-appearing, toxic-appearing or diaphoretic.  HENT:     Head: Normocephalic and atraumatic.     Right Ear: External ear normal.     Left Ear:  External ear normal.     Nose: Nose normal.     Mouth/Throat:     Mouth: Mucous membranes are moist.     Pharynx: Oropharynx is clear.  Eyes:     General: No scleral icterus.       Right eye: No discharge.        Left eye: No discharge.     Extraocular Movements: Extraocular movements intact.     Conjunctiva/sclera: Conjunctivae normal.     Pupils: Pupils are equal, round, and reactive to light.  Cardiovascular:     Rate and Rhythm: Normal rate and regular rhythm.     Pulses: Normal pulses.     Heart sounds: Normal heart sounds. No murmur heard.    No friction rub. No gallop.  Pulmonary:     Effort: Pulmonary effort is normal. No respiratory distress.     Breath sounds: Normal breath sounds. No stridor. No wheezing, rhonchi or rales.  Chest:     Chest wall: No tenderness.  Musculoskeletal:        General: Normal range of motion.     Cervical back: Normal range of motion and neck supple.  Skin:    General: Skin is warm and dry.     Capillary Refill: Capillary refill takes less than 2 seconds.     Coloration: Skin is not jaundiced or pale.     Findings: No bruising, erythema, lesion or rash.  Neurological:     General: No focal deficit present.     Mental Status: She is alert and oriented to person, place, and time. Mental status is at baseline.  Psychiatric:        Mood and Affect: Mood normal.        Behavior: Behavior normal.        Thought Content: Thought content normal.        Judgment: Judgment normal.     Results for orders placed or performed in visit on 09/08/24  GC/Chlamydia Probe Amp   Collection Time: 09/08/24  3:51 PM   Specimen: Urine   UR  Result Value Ref Range   Chlamydia trachomatis, NAA Negative Negative   Neisseria Gonorrhoeae by PCR Negative Negative  CBC with Differential/Platelet   Collection Time: 09/08/24  3:52 PM  Result Value Ref Range   WBC 7.7 3.4 - 10.8 x10E3/uL   RBC 4.67 3.77 - 5.28 x10E6/uL   Hemoglobin 14.4 11.1 - 15.9 g/dL  Hematocrit 42.3 34.0 - 46.6 %   MCV 91 79 - 97 fL   MCH 30.8 26.6 - 33.0 pg   MCHC 34.0 31.5 - 35.7 g/dL   RDW 87.7 88.2 - 84.5 %   Platelets 276 150 - 450 x10E3/uL   Neutrophils 63 Not Estab. %   Lymphs 27 Not Estab. %   Monocytes 8 Not Estab. %   Eos 1 Not Estab. %   Basos 1 Not Estab. %   Neutrophils Absolute 4.9 1.4 - 7.0 x10E3/uL   Lymphocytes Absolute 2.1 0.7 - 3.1 x10E3/uL   Monocytes Absolute 0.6 0.1 - 0.9 x10E3/uL   EOS (ABSOLUTE) 0.1 0.0 - 0.4 x10E3/uL   Basophils Absolute 0.0 0.0 - 0.2 x10E3/uL   Immature Granulocytes 0 Not Estab. %   Immature Grans (Abs) 0.0 0.0 - 0.1 x10E3/uL  Comprehensive metabolic panel with GFR   Collection Time: 09/08/24  3:52 PM  Result Value Ref Range   Glucose 83 70 - 99 mg/dL   BUN 13 6 - 20 mg/dL   Creatinine, Ser 9.30 0.57 - 1.00 mg/dL   eGFR 871 >40 fO/fpw/8.26   BUN/Creatinine Ratio 19 9 - 23   Sodium 137 134 - 144 mmol/L   Potassium 3.9 3.5 - 5.2 mmol/L   Chloride 101 96 - 106 mmol/L   CO2 22 20 - 29 mmol/L   Calcium 9.6 8.7 - 10.2 mg/dL   Total Protein 7.4 6.0 - 8.5 g/dL   Albumin 4.9 4.0 - 5.0 g/dL   Globulin, Total 2.5 1.5 - 4.5 g/dL   Bilirubin Total 0.5 0.0 - 1.2 mg/dL   Alkaline Phosphatase 80 42 - 106 IU/L   AST 17 0 - 40 IU/L   ALT 14 0 - 32 IU/L  Lipid Panel w/o Chol/HDL Ratio   Collection Time: 09/08/24  3:52 PM  Result Value Ref Range   Cholesterol, Total 166 100 - 169 mg/dL   Triglycerides 68 0 - 89 mg/dL   HDL 51 >60 mg/dL   VLDL Cholesterol Cal 13 5 - 40 mg/dL   LDL Chol Calc (NIH) 897 0 - 109 mg/dL  TSH   Collection Time: 09/08/24  3:52 PM  Result Value Ref Range   TSH 0.619 0.450 - 4.500 uIU/mL      Assessment & Plan:   Problem List Items Addressed This Visit       Other   Panic attacks   Will change from prozac  to sertraline  and recheck in about 6 weeks. 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 Call with any concerns.         Follow up plan: Return in about 6 weeks (around 10/22/2024) for virtual OK.      "

## 2024-10-02 ENCOUNTER — Other Ambulatory Visit: Payer: Self-pay | Admitting: Family Medicine

## 2024-10-05 NOTE — Telephone Encounter (Signed)
 Requested medication (s) are due for refill today: no  Requested medication (s) are on the active medication list: yes  Last refill:  09/10/24 #30 3 RF  Future visit scheduled: yes  Notes to clinic:  requesting 90 day refills   Requested Prescriptions  Pending Prescriptions Disp Refills   sertraline  (ZOLOFT ) 50 MG tablet [Pharmacy Med Name: SERTRALINE  HCL 50 MG TABLET] 90 tablet 2    Sig: TAKE 1 TABLET BY MOUTH EVERY DAY     Psychiatry:  Antidepressants - SSRI - sertraline  Passed - 10/05/2024 12:03 PM      Passed - AST in normal range and within 360 days    AST  Date Value Ref Range Status  09/08/2024 17 0 - 40 IU/L Final         Passed - ALT in normal range and within 360 days    ALT  Date Value Ref Range Status  09/08/2024 14 0 - 32 IU/L Final         Passed - Completed PHQ-2 or PHQ-9 in the last 360 days      Passed - Valid encounter within last 6 months    Recent Outpatient Visits           3 weeks ago Mixed obsessional thoughts and acts   [facility] [place], Megan P, DO   3 weeks ago Routine general medical examination at a health care facility   [facility], [place] P, DO   9 months ago Panic attacks   [facility] [employer], [place] P, DO   10 months ago Panic attacks   [place] [facility] [place], Megan P, DO   10 months ago Panic attacks   [place] [facility] [place], [place], DO

## 2024-10-22 ENCOUNTER — Ambulatory Visit: Admitting: Family Medicine
